# Patient Record
Sex: Female | Born: 1985 | Race: White | Hispanic: No | Marital: Single | Smoking: Current every day smoker
Health system: Southern US, Community
[De-identification: ages and names within clinical notes are randomized; demographics above are authoritative.]

## PROBLEM LIST (undated history)

## (undated) DIAGNOSIS — F32A Depression, unspecified: Secondary | ICD-10-CM

## (undated) DIAGNOSIS — F329 Major depressive disorder, single episode, unspecified: Secondary | ICD-10-CM

## (undated) HISTORY — PX: CHOLECYSTECTOMY: SHX55

## (undated) HISTORY — PX: DENTAL SURGERY: SHX609

## (undated) HISTORY — PX: TUBAL LIGATION: SHX77

---

## 2005-03-15 ENCOUNTER — Ambulatory Visit: Payer: Self-pay | Admitting: Family Medicine

## 2005-04-15 ENCOUNTER — Emergency Department: Payer: Self-pay | Admitting: Unknown Physician Specialty

## 2006-08-04 ENCOUNTER — Emergency Department: Payer: Self-pay

## 2006-08-06 ENCOUNTER — Emergency Department: Payer: Self-pay | Admitting: Emergency Medicine

## 2006-11-10 ENCOUNTER — Emergency Department: Payer: Self-pay | Admitting: Internal Medicine

## 2007-07-10 ENCOUNTER — Emergency Department: Payer: Self-pay | Admitting: Emergency Medicine

## 2007-12-22 ENCOUNTER — Emergency Department: Payer: Self-pay | Admitting: Emergency Medicine

## 2008-03-09 ENCOUNTER — Emergency Department: Payer: Self-pay | Admitting: Emergency Medicine

## 2008-03-17 ENCOUNTER — Emergency Department: Payer: Self-pay | Admitting: Emergency Medicine

## 2008-04-09 ENCOUNTER — Ambulatory Visit: Payer: Self-pay | Admitting: Internal Medicine

## 2008-06-26 ENCOUNTER — Emergency Department: Payer: Self-pay | Admitting: Emergency Medicine

## 2008-08-03 ENCOUNTER — Emergency Department: Payer: Self-pay | Admitting: Unknown Physician Specialty

## 2008-09-27 ENCOUNTER — Emergency Department: Payer: Self-pay | Admitting: Emergency Medicine

## 2008-09-30 ENCOUNTER — Emergency Department: Payer: Self-pay | Admitting: Emergency Medicine

## 2008-12-11 ENCOUNTER — Emergency Department: Payer: Self-pay | Admitting: Emergency Medicine

## 2009-02-01 ENCOUNTER — Emergency Department: Payer: Self-pay | Admitting: Emergency Medicine

## 2009-02-25 ENCOUNTER — Emergency Department: Payer: Self-pay | Admitting: Emergency Medicine

## 2009-04-02 ENCOUNTER — Emergency Department: Payer: Self-pay | Admitting: Emergency Medicine

## 2010-06-14 IMAGING — CR DG CHEST 2V
1 series · 2 of 2 positions shown · non-contrast
Comparison: none

REASON FOR EXAM: abd. pain     Flex 6
COMMENTS:   LMP: negative

PROCEDURE:     DXR - DXR CHEST PA (OR AP) AND LATERAL  - April 02, 2009  [DATE]
RESULT:     Comparison: None

[Series 1: view not recorded · 0.17mm/px · 2 of 2 slices shown]
[im 1/2]
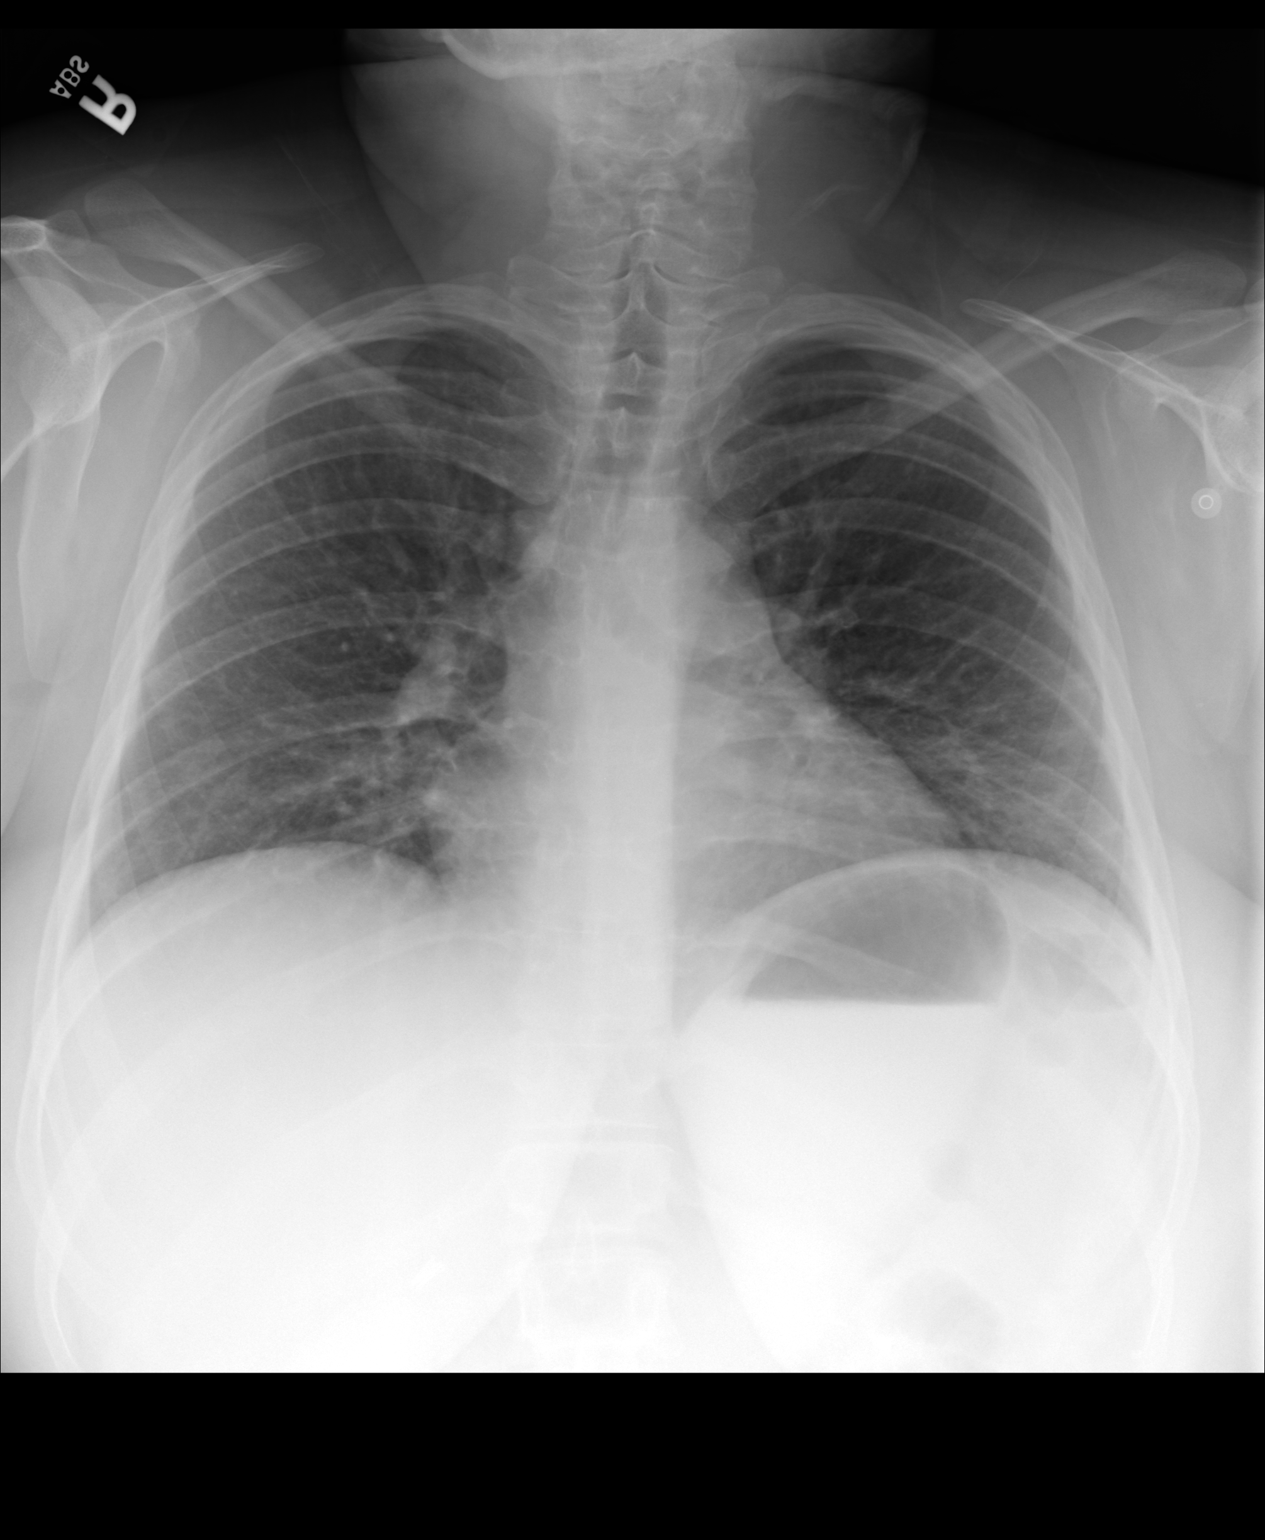
[im 2/2]
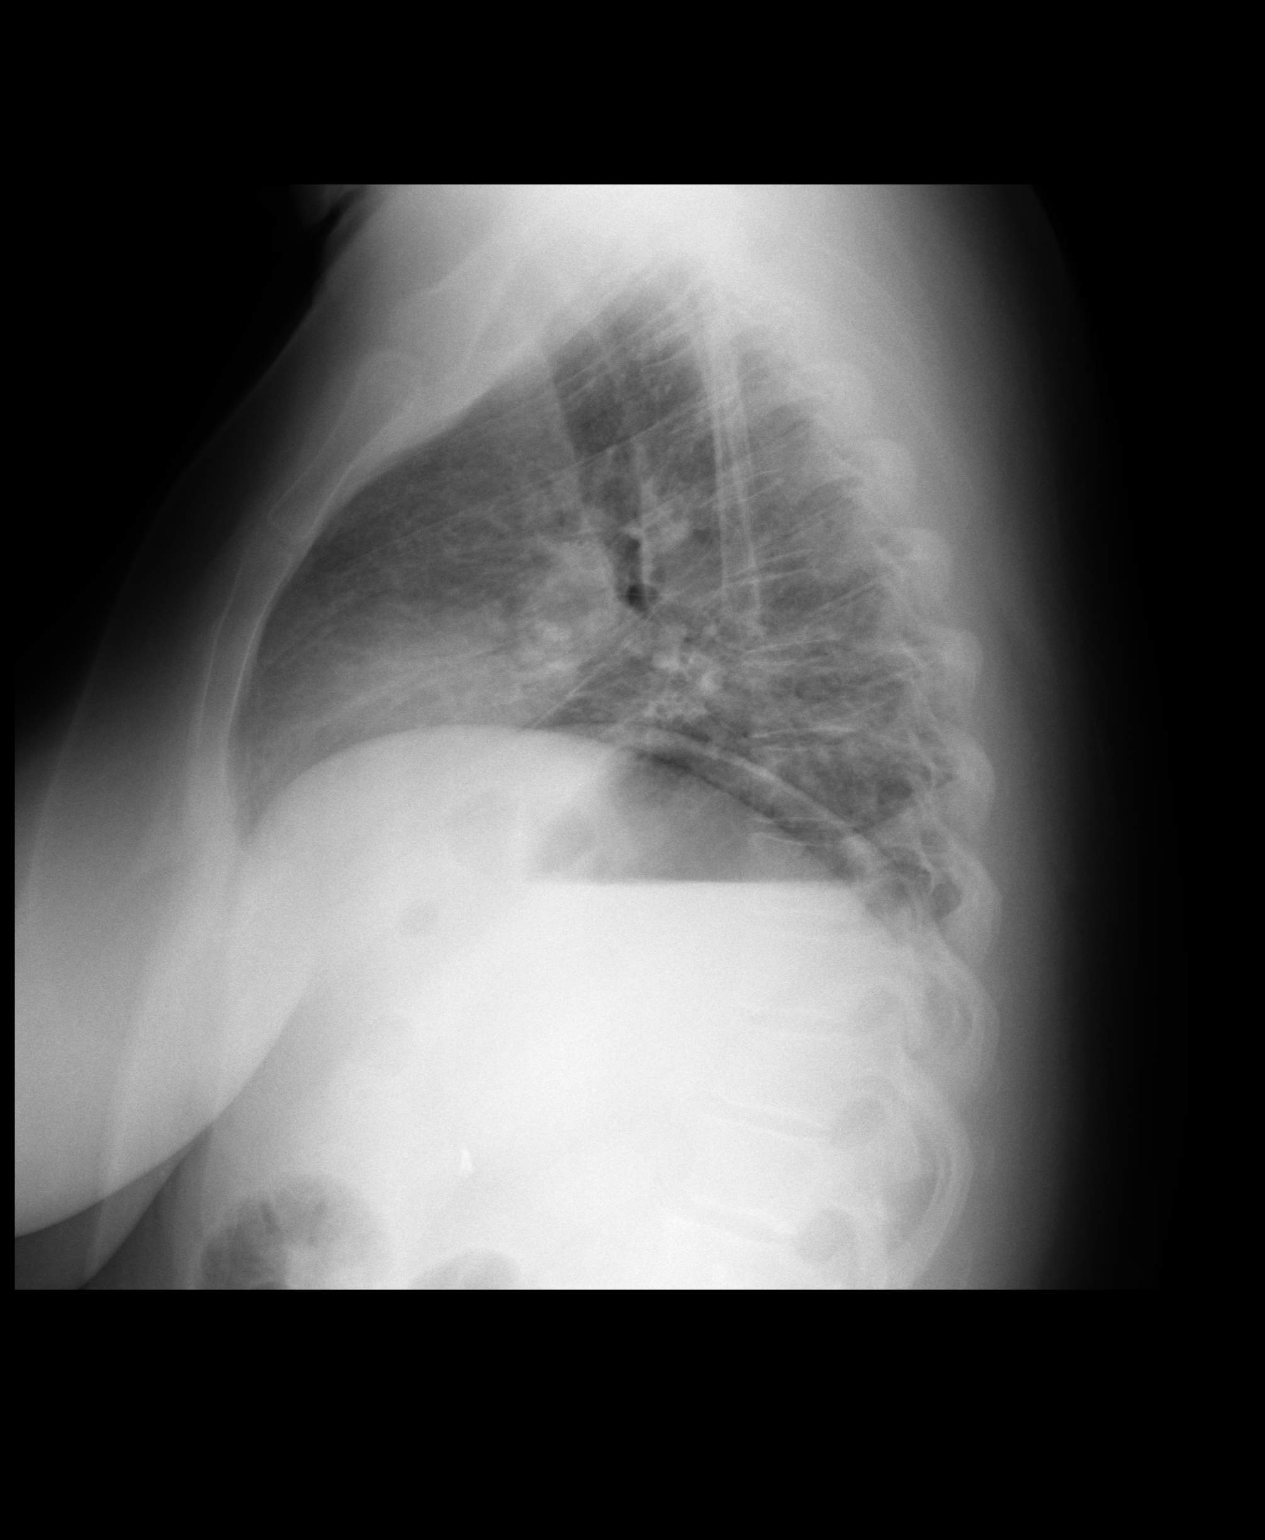

[2 of 2 positions shown; findings below may reference images not displayed]

FINDINGS: PA and lateral chest radiographs are provided. There is hazy airspace
opacity at the left lung base concerning for a developing infiltrate. The
right lung is clear. There is no pleural effusion or pneumothorax. The heart
and mediastinum are unremarkable. The osseous structures are unremarkable.
IMPRESSION: Left lower lobe hazy a airspace opacity concerning for a developing
infiltrate.

## 2011-01-22 ENCOUNTER — Inpatient Hospital Stay: Payer: Self-pay | Admitting: Obstetrics & Gynecology

## 2011-04-12 ENCOUNTER — Emergency Department: Payer: Self-pay | Admitting: Emergency Medicine

## 2011-07-13 ENCOUNTER — Observation Stay: Payer: Self-pay | Admitting: Obstetrics & Gynecology

## 2011-07-26 ENCOUNTER — Inpatient Hospital Stay: Payer: Self-pay

## 2011-08-01 LAB — PATHOLOGY REPORT

## 2016-12-03 ENCOUNTER — Ambulatory Visit
Admission: EM | Admit: 2016-12-03 | Discharge: 2016-12-03 | Disposition: A | Discharge status: Home / Self Care | Payer: Medicaid Other | Attending: Family Medicine | Admitting: Family Medicine

## 2016-12-03 ENCOUNTER — Encounter: Payer: Self-pay | Admitting: *Deleted

## 2016-12-03 DIAGNOSIS — R6889 Other general symptoms and signs: Secondary | ICD-10-CM

## 2016-12-03 DIAGNOSIS — J029 Acute pharyngitis, unspecified: Secondary | ICD-10-CM | POA: Diagnosis not present

## 2016-12-03 DIAGNOSIS — R05 Cough: Secondary | ICD-10-CM | POA: Diagnosis not present

## 2016-12-03 HISTORY — DX: Depression, unspecified: F32.A

## 2016-12-03 HISTORY — DX: Major depressive disorder, single episode, unspecified: F32.9

## 2016-12-03 LAB — RAPID STREP SCREEN (MED CTR MEBANE ONLY): Streptococcus, Group A Screen (Direct): NEGATIVE

## 2016-12-03 MED ORDER — FLUTICASONE PROPIONATE 50 MCG/ACT NA SUSP: 2.0000 | Freq: Every day | NASAL | 0 refills | Status: DC

## 2016-12-03 MED ORDER — BENZONATATE 200 MG PO CAPS: ORAL_CAPSULE | ORAL | 0 refills | Status: DC

## 2016-12-03 MED ORDER — OSELTAMIVIR PHOSPHATE 75 MG PO CAPS: 75.0000 mg | ORAL_CAPSULE | Freq: Two times a day (BID) | ORAL | 0 refills | Status: DC

## 2016-12-03 NOTE — ED Triage Notes (Signed)
Patient started having symptoms of sore throat, cough, SOB, and fever 6 days ago. Children have been diagnosed with strep and the flu.

## 2016-12-03 NOTE — ED Provider Notes (Signed)
CSN: [id number]     Arrival date & time 12/03/16  1119 History   First MD Initiated Contact with Patient 12/03/16 1233     Chief Complaint  Patient presents with  . Shortness of Breath  . Cough  . Sore Throat  . Nasal Congestion   (Consider location/radiation/quality/duration/timing/severity/associated sxs/prior Treatment) HPI This a 31 year old female who presents with a six-day history of sore throat cough shortness of breath and fever. She states that her one child was diagnosed with strep throat and the other one with the flu. He smokes a pack of cigarettes a day as usual but states that she has not been smoking as much although she does smell heavily of smoke. She has no fever today but has been taking over-the-counter preparations which may contain antipyretics. States that she is coughing at nighttime quite a bit.       Past Medical History:  Diagnosis Date  . Depression    Past Surgical History:  Procedure Laterality Date  . CHOLECYSTECTOMY    . DENTAL SURGERY    . TUBAL LIGATION     Family History  Problem Relation Age of Onset  . Healthy Mother   . Healthy Father    Social History  Substance Use Topics  . Smoking status: Current Every Day Smoker  . Smokeless tobacco: Never Used  . Alcohol use No   OB History    No data available     Review of Systems  Constitutional: Positive for activity change and fever. Negative for chills and fatigue.  HENT: Positive for congestion, postnasal drip, rhinorrhea and sore throat.   Respiratory: Positive for cough and shortness of breath. Negative for wheezing and stridor.   All other systems reviewed and are negative.   Allergies  Patient has no known allergies.  Home Medications   Prior to Admission medications   Medication Sig Start Date End Date Taking? Authorizing Provider  buprenorphine-naloxone (SUBOXONE) 8-2 mg SUBL SL tablet Place 1 tablet under the tongue daily.   Yes Historical Provider, MD  LORazepam  (ATIVAN) 1 MG tablet Take 1 mg by mouth 2 (two) times daily.   Yes Historical Provider, MD  QUEtiapine (SEROQUEL) 100 MG tablet Take 100 mg by mouth at bedtime.   Yes Historical Provider, MD  benzonatate (TESSALON) 200 MG capsule Take one cap TID PRN cough 12/03/16   Lutricia FeilWilliam P Hatsuko Bizzarro, PA-C  fluticasone [facility](FLONASE) 50 MCG/ACT nasal spray Place 2 sprays into both nostrils daily. 12/03/16   Lutricia FeilWilliam P Latonya Nelon, PA-C  oseltamivir (TAMIFLU) 75 MG capsule Take 1 capsule (75 mg total) by mouth every 12 (twelve) hours. 12/03/16   Lutricia FeilWilliam P Diamone Whistler, PA-C   Meds Ordered and Administered this Visit  Medications - No data to display  BP 113/74 (BP Location: Left Arm)   Pulse (!) 102   Temp 98.2 F (36.8 C) (Oral)   Resp 18   Ht 5\' 2"  (1.575 m)   Wt 196 lb (88.9 kg)   LMP 11/17/2016   SpO2 98%   BMI 35.85 kg/m  No data found.   Physical Exam  Constitutional: She is oriented to person, place, and time. She appears well-developed and well-nourished. No distress.  HENT:  Head: Normocephalic and atraumatic.  Right Ear: External ear normal.  Left Ear: External ear normal.  Mouth/Throat: Oropharynx is clear and moist.  Eyes: EOM are normal. Pupils are equal, round, and reactive to light. Right eye exhibits no discharge. Left eye exhibits no discharge.  Neck: Normal range  of motion. Neck supple.  Pulmonary/Chest: Effort normal and breath sounds normal. No respiratory distress. She has no wheezes. She has no rales.  Musculoskeletal: Normal range of motion.  Lymphadenopathy:    She has no cervical adenopathy.  Neurological: She is alert and oriented to person, place, and time.  Skin: Skin is warm and dry. She is not diaphoretic.  Psychiatric: She has a normal mood and affect. Her behavior is normal. Judgment and thought content normal.  Nursing note and vitals reviewed.   Urgent Care Course     Procedures (including critical care time)  Labs Review Labs Reviewed  RAPID STREP SCREEN (NOT AT [facility])   CULTURE, GROUP A STREP [facility])    Imaging Review No results found.   Visual Acuity Review  Right Eye Distance:   Left Eye Distance:   Bilateral Distance:    Right Eye Near:   Left Eye Near:    Bilateral Near:         MDM   1. Flu-like symptoms    Discharge Medication List as of 12/03/2016 12:50 PM    START taking these medications   Details  benzonatate (TESSALON) 200 MG capsule Take one cap TID PRN cough, Normal    fluticasone (FLONASE) 50 MCG/ACT nasal spray Place 2 sprays into both nostrils daily., Starting Mon 12/03/2016, Normal    oseltamivir (TAMIFLU) 75 MG capsule Take 1 capsule (75 mg total) by mouth every 12 (twelve) hours., Starting Mon 12/03/2016, Normal      Plan: 1. Test/x-ray results and diagnosis reviewed with patient 2. rx as per orders; risks, benefits, potential side effects reviewed with patient 3. Recommend supportive treatment with Rest and fluids. Tylenol and Motrin for body aches and fever. Patient needs to stop smoking completely. He is Flonase for up to 3-4 weeks for complete drainage. If she is not improving she should follow-up with her primary care physician. 4. F/u prn if symptoms worsen or don't improve     Lutricia Feil, PA-C 12/03/16 1256

## 2016-12-05 ENCOUNTER — Telehealth: Payer: Self-pay

## 2016-12-05 LAB — CULTURE, GROUP A STREP (THRC)

## 2016-12-05 MED ORDER — AMOXICILLIN 875 MG PO TABS: 875.0000 mg | ORAL_TABLET | Freq: Two times a day (BID) | ORAL | 0 refills | Status: DC

## 2016-12-05 NOTE — Telephone Encounter (Signed)
-----   Message from Hassan RowanEugene Wade, MD sent at 12/05/2016  1:33 PM EST ----- Please notify patient that her strep culture was positive for atypical Streptococcus. Providing she's not allergic to anything I would recommend amoxicillin 875 one tablet twice a day for 10 days #20

## 2017-07-08 ENCOUNTER — Ambulatory Visit
Admission: EM | Admit: 2017-07-08 | Discharge: 2017-07-08 | Disposition: A | Discharge status: Home / Self Care | Payer: Medicaid Other

## 2017-07-08 ENCOUNTER — Encounter: Payer: Self-pay | Admitting: Emergency Medicine

## 2017-07-08 DIAGNOSIS — H65111 Acute and subacute allergic otitis media (mucoid) (sanguinous) (serous), right ear: Secondary | ICD-10-CM

## 2017-07-08 MED ORDER — AZITHROMYCIN 500 MG PO TABS
500.0000 mg | ORAL_TABLET | Freq: Once | ORAL | Status: AC
  Administered 2017-07-08: 500 mg via ORAL

## 2017-07-08 MED ORDER — AZITHROMYCIN 250 MG PO TABS: 250.0000 mg | ORAL_TABLET | Freq: Every day | ORAL | 0 refills | Status: DC

## 2017-07-08 MED ORDER — ACETAMINOPHEN 500 MG PO TABS
1000.0000 mg | ORAL_TABLET | Freq: Once | ORAL | Status: AC
  Administered 2017-07-08: 1000 mg via ORAL

## 2017-07-08 NOTE — Discharge Instructions (Signed)
Please take antibiotics as prescribed and return to the emergency Department or clinic for any fevers, worsening symptoms or changes in her health. Alternate Tylenol and ibuprofen for pain.

## 2017-07-08 NOTE — ED Triage Notes (Signed)
Patient c/o right ear pain x 1 day. Patient denies fever, cough, congestion or any other associated symptoms. Patient has been using Tylenol and Ibuprofen without relief.

## 2017-07-08 NOTE — ED Provider Notes (Signed)
[facility]    CSN: [id number] Arrival date & time: 07/08/17  1815     History   Chief Complaint Chief Complaint  Patient presents with  . Otalgia    right    HPI Roberta George is a 31 y.o. female who presents today for evaluation of right ear pain. Ear pain isn't present for 1 day. Mild congestion for the last few days. No fevers. She denies any trauma or injury. No headaches, numbness or tingling. No vision changes. She has been taking Tylenol and ibuprofen with no improvement. She denies any drainage.  HPI  Past Medical History:  Diagnosis Date  . Depression     There are no active problems to display for this patient.   Past Surgical History:  Procedure Laterality Date  . CHOLECYSTECTOMY    . DENTAL SURGERY    . TUBAL LIGATION      OB History    No data available       Home Medications    Prior to Admission medications   Medication Sig Start Date End Date Taking? Authorizing Provider  buprenorphine-naloxone (SUBOXONE) 8-2 mg SUBL SL tablet Place 1 tablet under the tongue daily.   Yes [provider]  LORazepam (ATIVAN) 1 MG tablet Take 1 mg by mouth 3 (three) times daily.    Yes [provider]  QUEtiapine (SEROQUEL) 200 MG tablet Take 200 mg by mouth at bedtime.   Yes [provider]  azithromycin (ZITHROMAX) 250 MG tablet Take 1 tablet (250 mg total) by mouth daily. Take first 2 tablets together, then 1 every day until finished. 07/08/17   Evon Slack, PA-C  QUEtiapine (SEROQUEL) 100 MG tablet Take 100 mg by mouth at bedtime.    [provider]    Family History Family History  Problem Relation Age of Onset  . Healthy Mother   . Healthy Father     Social History Social History  Substance Use Topics  . Smoking status: Current Every Day Smoker  . Smokeless tobacco: Never Used  . Alcohol use No     Allergies   Penicillins and Sulfa antibiotics   Review of Systems Review of Systems    Constitutional: Negative for fever.  HENT: Positive for ear pain. Negative for sinus pain and sinus pressure.   Respiratory: Negative for shortness of breath.   Cardiovascular: Negative for chest pain.  Gastrointestinal: Negative for abdominal pain.  Genitourinary: Negative for difficulty urinating, dysuria and urgency.  Musculoskeletal: Negative for back pain and myalgias.  Skin: Negative for rash.  Neurological: Negative for dizziness and headaches.     Physical Exam Triage Vital Signs ED Triage Vitals  Enc Vitals Group     BP 07/08/17 1924 121/82     Pulse Rate 07/08/17 1924 92     Resp 07/08/17 1924 16     Temp 07/08/17 1924 98.5 F (36.9 C)     Temp Source 07/08/17 1924 Oral     SpO2 07/08/17 1924 100 %     Weight 07/08/17 1926 180 lb (81.6 kg)     Height 07/08/17 1926  (1.575 m)     Head Circumference --      Peak Flow --      Pain Score 07/08/17 1926 9     Pain Loc --      Pain Edu? --      Excl. in GC? --    No data found.   Updated Vital Signs BP  121/82 (BP Location: Left Arm)   Pulse 92   Temp 98.5 F (36.9 C) (Oral)   Resp 16   Ht  (1.575 m)   Wt 180 lb (81.6 kg)   LMP 07/01/2017 (Approximate)   SpO2 100%   BMI 32.92 kg/m   Visual Acuity Right Eye Distance:   Left Eye Distance:   Bilateral Distance:    Right Eye Near:   Left Eye Near:    Bilateral Near:     Physical Exam  Constitutional: She is oriented to person, place, and time. She appears well-developed and well-nourished. No distress.  HENT:  Head: Normocephalic and atraumatic.  Right Ear: External ear and ear canal normal. No tenderness. Tympanic membrane is injected and erythematous. Tympanic membrane is not perforated.  Left Ear: External ear normal.  Mouth/Throat: Oropharynx is clear and moist. No oropharyngeal exudate.  No mastoid tenderness.  Eyes: Conjunctivae are normal. Right eye exhibits no discharge. Left eye exhibits no discharge.  Neck: Normal range of motion.   Cardiovascular: Normal rate.   Pulmonary/Chest: No respiratory distress.  Musculoskeletal: Normal range of motion. She exhibits no deformity.  Neurological: She is alert and oriented to person, place, and time. She has normal reflexes.  Skin: Skin is warm and dry.  Psychiatric: She has a normal mood and affect. Her behavior is normal. Thought content normal.     UC Treatments / Results  Labs (all labs ordered are listed, but only abnormal results are displayed) Labs Reviewed - No data to display  EKG  EKG Interpretation None       Radiology No results found.  Procedures Procedures (including critical care time)  Medications Ordered in UC Medications  azithromycin (ZITHROMAX) tablet 500 mg (500 mg Oral Given 07/08/17 2010)  acetaminophen (TYLENOL) tablet 1,000 mg (1,000 mg Oral Given 07/08/17 2010)     Initial Impression / Assessment and Plan / UC Course  I have reviewed the triage vital signs and the nursing notes.  Pertinent labs & imaging results that were available during my care of the patient were reviewed by me and considered in my medical decision making (see chart for details).     31 year old female with right otitis media. She is allergic to penicillin she started on azithromycin. Alternate Tylenol and ibuprofen as needed.  Final Clinical Impressions(s) / UC Diagnoses   Final diagnoses:  Acute mucoid otitis media of right ear    New Prescriptions Discharge Medication List as of 07/08/2017  8:06 PM       Controlled Substance Prescriptions [place] Controlled Substance Registry consulted? Not Applicable   Ronnette Juniper 07/08/17 2020

## 2017-10-25 ENCOUNTER — Emergency Department: Payer: Medicaid Other

## 2017-10-25 ENCOUNTER — Emergency Department
Admission: EM | Admit: 2017-10-25 | Discharge: 2017-10-25 | Disposition: A | Discharge status: Home / Self Care | Payer: Medicaid Other | Attending: Student in an Organized Health Care Education/Training Program | Admitting: Student in an Organized Health Care Education/Training Program

## 2017-10-25 ENCOUNTER — Encounter: Payer: Self-pay | Admitting: Emergency Medicine

## 2017-10-25 ENCOUNTER — Other Ambulatory Visit: Payer: Self-pay

## 2017-10-25 DIAGNOSIS — Z79899 Other long term (current) drug therapy: Secondary | ICD-10-CM | POA: Insufficient documentation

## 2017-10-25 DIAGNOSIS — Y9389 Activity, other specified: Secondary | ICD-10-CM | POA: Diagnosis not present

## 2017-10-25 DIAGNOSIS — S8002XA Contusion of left knee, initial encounter: Secondary | ICD-10-CM | POA: Diagnosis not present

## 2017-10-25 DIAGNOSIS — F17210 Nicotine dependence, cigarettes, uncomplicated: Secondary | ICD-10-CM | POA: Insufficient documentation

## 2017-10-25 DIAGNOSIS — Y999 Unspecified external cause status: Secondary | ICD-10-CM | POA: Diagnosis not present

## 2017-10-25 DIAGNOSIS — R51 Headache: Secondary | ICD-10-CM | POA: Diagnosis present

## 2017-10-25 DIAGNOSIS — Y92410 Unspecified street and highway as the place of occurrence of the external cause: Secondary | ICD-10-CM | POA: Insufficient documentation

## 2017-10-25 DIAGNOSIS — R0789 Other chest pain: Secondary | ICD-10-CM | POA: Diagnosis not present

## 2017-10-25 DIAGNOSIS — G44311 Acute post-traumatic headache, intractable: Secondary | ICD-10-CM | POA: Diagnosis not present

## 2017-10-25 MED ORDER — KETOROLAC TROMETHAMINE 30 MG/ML IJ SOLN
30.0000 mg | Freq: Once | INTRAMUSCULAR | Status: AC
  Administered 2017-10-25: 30 mg via INTRAMUSCULAR
  Filled 2017-10-25: qty 1

## 2017-10-25 MED ORDER — CYCLOBENZAPRINE HCL 10 MG PO TABS: 10.0000 mg | ORAL_TABLET | Freq: Three times a day (TID) | ORAL | 0 refills | Status: AC | PRN

## 2017-10-25 MED ORDER — NAPROXEN 500 MG PO TABS: 500.0000 mg | ORAL_TABLET | Freq: Two times a day (BID) | ORAL | 0 refills | Status: AC
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
## 2017-10-25 NOTE — Discharge Instructions (Signed)
Follow-up with the primary care provider of your choice for symptoms that are not improving over the week. ° °Return to the emergency department for symptoms of change or worsen if you are unable to schedule an appointment. °

## 2018-12-18 ENCOUNTER — Ambulatory Visit
Admission: EM | Admit: 2018-12-18 | Discharge: 2018-12-18 | Disposition: A | Discharge status: Home / Self Care | Payer: Medicaid Other | Attending: Family Medicine | Admitting: Family Medicine

## 2018-12-18 DIAGNOSIS — J111 Influenza due to unidentified influenza virus with other respiratory manifestations: Secondary | ICD-10-CM

## 2018-12-18 DIAGNOSIS — J029 Acute pharyngitis, unspecified: Secondary | ICD-10-CM | POA: Diagnosis not present

## 2018-12-18 DIAGNOSIS — R69 Illness, unspecified: Secondary | ICD-10-CM | POA: Diagnosis not present

## 2018-12-18 DIAGNOSIS — R05 Cough: Secondary | ICD-10-CM | POA: Diagnosis not present

## 2018-12-18 DIAGNOSIS — R0981 Nasal congestion: Secondary | ICD-10-CM | POA: Diagnosis not present

## 2018-12-18 DIAGNOSIS — F17210 Nicotine dependence, cigarettes, uncomplicated: Secondary | ICD-10-CM | POA: Diagnosis not present

## 2018-12-18 DIAGNOSIS — B9789 Other viral agents as the cause of diseases classified elsewhere: Secondary | ICD-10-CM

## 2018-12-18 LAB — RAPID INFLUENZA A&B ANTIGENS: Influenza A (ARMC): NEGATIVE

## 2018-12-18 LAB — RAPID INFLUENZA A&B ANTIGENS (ARMC ONLY): INFLUENZA B (ARMC): NEGATIVE

## 2018-12-18 MED ORDER — DOXYCYCLINE HYCLATE 100 MG PO CAPS: 100.0000 mg | ORAL_CAPSULE | Freq: Two times a day (BID) | ORAL | 0 refills | Status: AC

## 2018-12-18 MED ORDER — OSELTAMIVIR PHOSPHATE 75 MG PO CAPS: 75.0000 mg | ORAL_CAPSULE | Freq: Two times a day (BID) | ORAL | 0 refills | Status: AC

## 2018-12-18 MED ORDER — BENZONATATE 100 MG PO CAPS: 100.0000 mg | ORAL_CAPSULE | Freq: Three times a day (TID) | ORAL | 0 refills | Status: AC | PRN

## 2018-12-18 MED ORDER — ALBUTEROL SULFATE HFA 108 (90 BASE) MCG/ACT IN AERS: 1.0000 | INHALATION_SPRAY | Freq: Four times a day (QID) | RESPIRATORY_TRACT | 0 refills | Status: AC | PRN

## 2018-12-18 NOTE — Discharge Instructions (Signed)
Rest. ° °Fluids. ° °Medications as prescribed. ° °Take care ° °Dr. Jerimah Witucki  °

## 2018-12-18 NOTE — ED Provider Notes (Signed)
[facility]    CSN: [id number] Arrival date & time: 12/18/18  0909  History   Chief Complaint Chief Complaint  Patient presents with  . Cough   HPI  33 year old female presents with respiratory symptoms.  Patient states that she has been sick since Thursday.  Reports sore throat, cough, congestion, and recent development of fever.  Patient states that she has had fever for the past few days.  Patient seems to be unclear the exact timeframe that she is developed fever.  She has been medicating herself around the clock with Tylenol and ibuprofen.  Feels poorly.  Her daughter is also sick with similar symptoms.  No known exacerbating factors.  Fever has improved with Tylenol and ibuprofen.  No other associated symptoms.  No other complaints.  PMH, Surgical Hx, Family Hx, Social History reviewed and updated as below.  Past Medical History:  Diagnosis Date  . Depression    Past Surgical History:  Procedure Laterality Date  . CHOLECYSTECTOMY    . DENTAL SURGERY    . TUBAL LIGATION      OB History   No obstetric history on file.    Home Medications    Prior to Admission medications   Medication Sig Start Date End Date Taking? Authorizing Provider  albuterol (PROVENTIL HFA;VENTOLIN HFA) 108 (90 Base) MCG/ACT inhaler Inhale 1-2 puffs into the lungs every 6 (six) hours as needed for wheezing or shortness of breath. 12/18/18   Tommie Sams, DO  benzonatate (TESSALON) 100 MG capsule Take 1 capsule (100 mg total) by mouth 3 (three) times daily as needed. 12/18/18   Tommie Sams, DO  buprenorphine-naloxone (SUBOXONE) 8-2 mg SUBL SL tablet Place 1 tablet under the tongue daily.    [provider]  cyclobenzaprine (FLEXERIL) 10 MG tablet Take 1 tablet (10 mg total) by mouth 3 (three) times daily as needed for muscle spasms. 10/25/17   Triplett, Rulon Eisenmenger B, FNP  doxycycline (VIBRAMYCIN) 100 MG capsule Take 1 capsule (100 mg total) by mouth 2 (two) times daily. 12/18/18   Tommie Sams, DO  LORazepam (ATIVAN) 1 MG tablet Take 1 mg by mouth 3 (three) times daily.     [provider]  naproxen (NAPROSYN) 500 MG tablet Take 1 tablet (500 mg total) by mouth 2 (two) times daily with a meal. 10/25/17   Triplett, Cari B, FNP  oseltamivir (TAMIFLU) 75 MG capsule Take 1 capsule (75 mg total) by mouth every 12 (twelve) hours. 12/18/18   Tommie Sams, DO  QUEtiapine (SEROQUEL) 100 MG tablet Take 100 mg by mouth at bedtime.    [provider]  QUEtiapine (SEROQUEL) 200 MG tablet Take 200 mg by mouth at bedtime.    [provider]    Family History Family History  Problem Relation Age of Onset  . Healthy Mother   . Healthy Father     Social History Social History   Tobacco Use  . Smoking status: Current Every Day Smoker  . Smokeless tobacco: Never Used  Substance Use Topics  . Alcohol use: No  . Drug use: No     Allergies   Codeine; Penicillins; and Sulfa antibiotics   Review of Systems Review of Systems  Constitutional: Positive for fever.  HENT: Positive for congestion and sore throat.   Respiratory: Positive for cough.    Physical Exam Triage Vital Signs ED Triage Vitals  Enc Vitals Group     BP 12/18/18 0927 (!) 145/96  Pulse Rate 12/18/18 0927 (!) 106     Resp 12/18/18 0927 16     Temp 12/18/18 0927 98.2 F (36.8 C)     Temp Source 12/18/18 0927 Oral     SpO2 12/18/18 0927 97 %     Weight 12/18/18 0928 215 lb (97.5 kg)     Height 12/18/18 0928  (1.549 m)     Head Circumference --      Peak Flow --      Pain Score 12/18/18 0928 8     Pain Loc --      Pain Edu? --      Excl. in [place]? --    Updated Vital Signs BP (!) 145/96   Pulse (!) 106   Temp 98.2 F (36.8 C) (Oral)   Resp 16   Ht  (1.549 m)   Wt 97.5 kg   SpO2 97%   BMI 40.62 kg/m   Visual Acuity Right Eye Distance:   Left Eye Distance:   Bilateral Distance:    Right Eye Near:   Left Eye Near:    Bilateral Near:     Physical  Exam Vitals signs and nursing note reviewed.  Constitutional:      General: She is not in acute distress.    Appearance: Normal appearance.  HENT:     Head: Normocephalic and atraumatic.     Right Ear: Tympanic membrane normal.     Left Ear: Tympanic membrane normal.     Mouth/Throat:     Pharynx: Oropharynx is clear. No oropharyngeal exudate.  Eyes:     General:        Right eye: No discharge.        Left eye: No discharge.     Conjunctiva/sclera: Conjunctivae normal.  Cardiovascular:     Rate and Rhythm: Regular rhythm. Tachycardia present.  Pulmonary:     Effort: Pulmonary effort is normal.     Comments: Diffuse wheezing. Neurological:     Mental Status: She is alert.  Psychiatric:        Mood and Affect: Mood normal.        Behavior: Behavior normal.    UC Treatments / Results  Labs (all labs ordered are listed, but only abnormal results are displayed) Labs Reviewed  RAPID INFLUENZA A&B ANTIGENS ([facility] ONLY)    EKG None  Radiology No results found.  Procedures Procedures (including critical care time)  Medications Ordered in UC Medications - No data to display  Initial Impression / Assessment and Plan / UC Course  I have reviewed the triage vital signs and the nursing notes.  Pertinent labs & imaging results that were available during my care of the patient were reviewed by me and considered in my medical decision making (see chart for details).    33 year old female presents with influenza-like illness.  Flu testing negative.  I am treating her empirically despite negative testing given her clinical picture, low yield of the test.  It is hard to discern whether this viral or now she is experiencing superimposed bacterial infection.  I am covering her with doxycycline to be conservative given persistent symptoms and lack of improvement.  Work note given.  Also giving albuterol and Tessalon Perles for wheezing and cough respectively.  Final Clinical  Impressions(s) / UC Diagnoses   Final diagnoses:  Influenza-like illness     Discharge Instructions     Rest.  Fluids.  Medications as prescribed.  Take care  Dr. Adriana Simas  ED Prescriptions    Medication Sig Dispense Auth. Provider   doxycycline (VIBRAMYCIN) 100 MG capsule Take 1 capsule (100 mg total) by mouth 2 (two) times daily. 14 capsule Korrie Hofbauer G, DO   oseltamivir (TAMIFLU) 75 MG capsule Take 1 capsule (75 mg total) by mouth every 12 (twelve) hours. 10 capsule Glyn Zendejas G, DO   albuterol (PROVENTIL HFA;VENTOLIN HFA) 108 (90 Base) MCG/ACT inhaler Inhale 1-2 puffs into the lungs every 6 (six) hours as needed for wheezing or shortness of breath. 1 Inhaler Mailani Degroote G, DO   benzonatate (TESSALON) 100 MG capsule Take 1 capsule (100 mg total) by mouth 3 (three) times daily as needed. 30 capsule Tommie Sams, DO     Controlled Substance Prescriptions [place] Controlled Substance Registry consulted? Not Applicable   Tommie Sams, DO 12/18/18 1155

## 2018-12-18 NOTE — ED Triage Notes (Signed)
Pt reports cough, congestion ongoing over last week assumed viral illness and started running fever yesterday
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
## 2019-03-27 ENCOUNTER — Ambulatory Visit
Admission: EM | Admit: 2019-03-27 | Discharge: 2019-03-27 | Disposition: A | Discharge status: Home / Self Care | Payer: Medicaid Other | Attending: Family Medicine | Admitting: Family Medicine

## 2019-03-27 ENCOUNTER — Encounter: Payer: Self-pay | Admitting: Emergency Medicine

## 2019-03-27 ENCOUNTER — Telehealth: Payer: Self-pay | Admitting: General Practice

## 2019-03-27 ENCOUNTER — Telehealth: Payer: Self-pay | Admitting: *Deleted

## 2019-03-27 ENCOUNTER — Other Ambulatory Visit: Payer: Self-pay

## 2019-03-27 DIAGNOSIS — J029 Acute pharyngitis, unspecified: Secondary | ICD-10-CM | POA: Insufficient documentation

## 2019-03-27 DIAGNOSIS — Z20822 Contact with and (suspected) exposure to covid-19: Secondary | ICD-10-CM

## 2019-03-27 DIAGNOSIS — Z7189 Other specified counseling: Secondary | ICD-10-CM | POA: Diagnosis present

## 2019-03-27 LAB — RAPID STREP SCREEN (MED CTR MEBANE ONLY): Streptococcus, Group A Screen (Direct): NEGATIVE

## 2019-03-27 MED ORDER — LIDOCAINE VISCOUS HCL 2 % MT SOLN: 10.0000 mL | Freq: Four times a day (QID) | OROMUCOSAL | 0 refills | Status: AC | PRN

## 2019-03-27 NOTE — Telephone Encounter (Signed)
Second attempt to reach pt for Covid testing.  VM full, unable to leave message.

## 2019-03-27 NOTE — Telephone Encounter (Signed)
Called pt at [facility] to scheduled Covid testing. No answer. Unable to lvm.

## 2019-03-27 NOTE — ED Triage Notes (Signed)
Patient c/o sore throat that started yesterday. Denies cough, denies fever.  

## 2019-03-27 NOTE — Telephone Encounter (Signed)
Marylene Land, NP  [facility]   Cc: Sandria Manly, RN        Sore throat   Attempted to call patient to schedule appointment for testing- no answer and mailbox is full and can not leave message. Call routed to nurse triage to try again later. Order has been put in for testing.

## 2019-03-27 NOTE — ED Provider Notes (Signed)
[facility] ____________________________________________  Time seen: Approximately 12:32 PM  I have reviewed the triage vital signs and the nursing notes.   HISTORY  Chief Complaint Sore Throat   HPI Roberta George D Castagna is a 33 y.o. female presenting for evaluation of sore throat present for the last 2 days.  States that she woke up yesterday morning with a sore throat and it was worse today.  Denies accompanying congestion, fevers or rash.  States she does have some chronic cough and she is a smoker, but denies any new cough.  Denies congestion.  Some mild right ear discomfort intermittently.  Some history of seasonal allergies.  Denies aggravating alleviating factors.  Present was doing well.  Denies known sick contacts.   Past Medical History:  Diagnosis Date  . Depression     There are no active problems to display for this patient.   Past Surgical History:  Procedure Laterality Date  . CHOLECYSTECTOMY    . DENTAL SURGERY    . TUBAL LIGATION       No current facility-administered medications for this encounter.   Current Outpatient Medications:  .  buprenorphine-naloxone (SUBOXONE) 8-2 mg SUBL SL tablet, Place 1 tablet under the tongue daily., Disp: , Rfl:  .  albuterol (PROVENTIL HFA;VENTOLIN HFA) 108 (90 Base) MCG/ACT inhaler, Inhale 1-2 puffs into the lungs every 6 (six) hours as needed for wheezing or shortness of breath., Disp: 1 Inhaler, Rfl: 0 .  benzonatate (TESSALON) 100 MG capsule, Take 1 capsule (100 mg total) by mouth 3 (three) times daily as needed., Disp: 30 capsule, Rfl: 0 .  cyclobenzaprine (FLEXERIL) 10 MG tablet, Take 1 tablet (10 mg total) by mouth 3 (three) times daily as needed for muscle spasms., Disp: 30 tablet, Rfl: 0 .  doxycycline (VIBRAMYCIN) 100 MG capsule, Take 1 capsule (100 mg total) by mouth 2 (two) times daily., Disp: 14 capsule, Rfl: 0 .  lidocaine (XYLOCAINE) 2 % solution, Use as directed 10 mLs in the mouth or throat every 6  (six) hours as needed (sore throat. gargle and spit as needed for sore throat.)., Disp: 100 mL, Rfl: 0 .  LORazepam (ATIVAN) 1 MG tablet, Take 1 mg by mouth 3 (three) times daily. , Disp: , Rfl:  .  naproxen (NAPROSYN) 500 MG tablet, Take 1 tablet (500 mg total) by mouth 2 (two) times daily with a meal., Disp: 30 tablet, Rfl: 0 .  oseltamivir (TAMIFLU) 75 MG capsule, Take 1 capsule (75 mg total) by mouth every 12 (twelve) hours., Disp: 10 capsule, Rfl: 0 .  QUEtiapine (SEROQUEL) 100 MG tablet, Take 100 mg by mouth at bedtime., Disp: , Rfl:  .  QUEtiapine (SEROQUEL) 200 MG tablet, Take 200 mg by mouth at bedtime., Disp: , Rfl:   Allergies Codeine, Penicillins, and Sulfa antibiotics  Family History  Problem Relation Age of Onset  . Healthy Mother   . Healthy Father     Social History Social History   Tobacco Use  . Smoking status: Current Every Day Smoker  . Smokeless tobacco: Never Used  Substance Use Topics  . Alcohol use: No  . Drug use: No    Review of Systems Constitutional: No fever ENT: Positive sore throat. Cardiovascular: Denies chest pain. Respiratory: Denies shortness of breath. Gastrointestinal: No abdominal pain.  Skin: Negative for rash.   ____________________________________________   PHYSICAL EXAM:  VITAL SIGNS: ED Triage Vitals  Enc Vitals Group     BP 03/27/19 1226 (!) 133/101  Pulse Rate 03/27/19 1226 97     Resp 03/27/19 1226 18     Temp 03/27/19 1226 98.7 F (37.1 C)     Temp Source 03/27/19 1226 Oral     SpO2 03/27/19 1226 97 %     Weight 03/27/19 1227 208 lb (94.3 kg)     Height 03/27/19 1227 5\' 1"  (1.549 m)     Head Circumference --      Peak Flow --      Pain Score 03/27/19 1227 8     Pain Loc --      Pain Edu? --      Excl. in [place]? --    Vitals:   03/27/19 1226 03/27/19 1227 03/27/19 1304  BP: (!) 133/101  (!) 118/96  Pulse: 97    Resp: 18    Temp: 98.7 F (37.1 C)    TempSrc: Oral    SpO2: 97%    Weight:  208 lb (94.3 kg)    Height:  5\' 1"  (1.549 m)      Constitutional: Alert and oriented. Well appearing and in no acute distress. Eyes: Conjunctivae are normal.  Head: Atraumatic. No sinus tenderness to palpation. No swelling. No erythema.  Ears: Left: Nontender, normal canal, no erythema, normal TM. Right: Nontender, normal canal, no erythema, slight effusion, otherwise normal TM.  Nose:No nasal congestion  Mouth/Throat: Mucous membranes are moist. Mild pharyngeal erythema. No tonsillar swelling or exudate.  Neck: No stridor.  No cervical spine tenderness to palpation. Hematological/Lymphatic/Immunilogical: No cervical lymphadenopathy. Cardiovascular: Normal rate, regular rhythm. Grossly normal heart sounds.  Good peripheral circulation. Respiratory: Normal respiratory effort.  No retractions. No wheezes, rales or rhonchi. Good air movement.  Musculoskeletal: Ambulatory with steady gait.  Neurologic:  Normal speech and language. No gait instability. Skin:  Skin appears warm, dry and intact. No rash noted. Psychiatric: Mood and affect are normal. Speech and behavior are normal. ___________________________________________   LABS (all labs ordered are listed, but only abnormal results are displayed)  Labs Reviewed  RAPID STREP SCREEN ([facility] ONLY)  CULTURE, GROUP A STREP [facility])    PROCEDURES Procedures   INITIAL IMPRESSION / ASSESSMENT AND PLAN / ED COURSE  Pertinent labs & imaging results that were available during my care of the patient were reviewed by me and considered in my medical decision making (see chart for details).  Well-appearing patient.  Strep negative, will culture.  Suspect viral pharyngitis.  Will evaluate COVID testing, ordered.  PRN viscous lidocaine gargles.  Encourage rest, fluids, supportive care, over-the-counter Tylenol Profen as needed.Discussed indication, risks and benefits of medications with patient.  Discussed follow up with Primary care physician this week.  Discussed follow up and return parameters including no resolution or any worsening concerns. Patient verbalized understanding and agreed to plan.   ____________________________________________   FINAL CLINICAL IMPRESSION(S) / ED DIAGNOSES  Final diagnoses:  Pharyngitis, unspecified etiology  Advice Given About Covid-19 Virus Infection     ED Discharge Orders         Ordered    lidocaine (XYLOCAINE) 2 % solution  Every 6 hours PRN     03/27/19 1257    Novel Coronavirus, NAA ([facility])  [place] testing site only     03/27/19 1258           Note: This dictation was prepared with Dragon dictation along with smaller phrase technology. Any transcriptional errors that result from this process are unintentional.         Sabra Heck,  Mardella LaymanLindsey, NP 03/27/19 1307

## 2019-03-27 NOTE — Telephone Encounter (Signed)
-----   Message from Marylene Land, NP sent at 03/27/2019 12:58 PM EDT ----- Regarding: [facility] testing Sore throat

## 2019-03-27 NOTE — Discharge Instructions (Signed)
Use medication as prescribed. Rest. Drink plenty of fluids.  Over-the-counter Tylenol ibuprofen as needed.  COVID-19 testing.  Refer to [facility] DHHS guidelines and follow.  Follow up with your primary care physician this week as needed. Return to Urgent care for new or worsening concerns.

## 2019-03-27 NOTE — Telephone Encounter (Signed)
Three attempts made to contact pt to schedule testing with no answer. Unable to leave a voicemail message.

## 2019-03-30 LAB — CULTURE, GROUP A STREP (THRC)
